# Patient Record
Sex: Female | Born: 2000 | Race: Black or African American | Hispanic: No | Marital: Single | Smoking: Never smoker
Health system: Southern US, Community
[De-identification: ages and names within clinical notes are randomized; demographics above are authoritative.]

## PROBLEM LIST (undated history)

## (undated) DIAGNOSIS — R011 Cardiac murmur, unspecified: Secondary | ICD-10-CM

## (undated) HISTORY — DX: Cardiac murmur, unspecified: R01.1

---
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
## 2020-03-01 ENCOUNTER — Other Ambulatory Visit: Payer: Self-pay

## 2020-03-01 ENCOUNTER — Ambulatory Visit: Payer: Medicaid Other | Attending: Internal Medicine

## 2020-03-01 DIAGNOSIS — Z23 Encounter for immunization: Secondary | ICD-10-CM

## 2020-03-01 NOTE — Progress Notes (Signed)
° °  Covid-19 Vaccination Clinic  Name:  Heather [name]    MRN: [id number] DOB: [birth year]  03/01/2020  [name] was observed post Covid-19 immunization for 15 minutes without incident. She was provided with Vaccine Information Sheet and instruction to access the V-Safe system.   Ms. Candy was instructed to call 911 with any severe reactions post vaccine:  Difficulty breathing   Swelling of face and throat   A fast heartbeat   A bad rash all over body   Dizziness and weakness   Immunizations Administered    Name Date Dose VIS Date Route   PFIZER Comrnaty(Gray TOP) Covid-19 Vaccine 03/01/2020  3:30 PM 0.3 mL 01/06/2020 Intramuscular   Manufacturer: [employer], [employer]   Lot: [id number]   NDC: [phone]

## 2021-12-27 ENCOUNTER — Other Ambulatory Visit: Payer: Self-pay | Admitting: Family Medicine

## 2021-12-27 DIAGNOSIS — Z3401 Encounter for supervision of normal first pregnancy, first trimester: Secondary | ICD-10-CM

## 2022-01-29 ENCOUNTER — Other Ambulatory Visit: Payer: Self-pay | Admitting: Family Medicine

## 2022-01-29 ENCOUNTER — Ambulatory Visit
Admission: RE | Admit: 2022-01-29 | Discharge: 2022-01-29 | Disposition: A | Discharge status: Home / Self Care | Payer: Medicaid Other | Source: Ambulatory Visit | Attending: Family Medicine | Admitting: Family Medicine

## 2022-01-29 DIAGNOSIS — Z3401 Encounter for supervision of normal first pregnancy, first trimester: Secondary | ICD-10-CM

## 2022-01-29 DIAGNOSIS — Z3A11 11 weeks gestation of pregnancy: Secondary | ICD-10-CM | POA: Insufficient documentation

## 2022-02-11 ENCOUNTER — Other Ambulatory Visit: Payer: Self-pay

## 2022-02-11 ENCOUNTER — Emergency Department
Admission: EM | Admit: 2022-02-11 | Discharge: 2022-02-11 | Disposition: A | Discharge status: Home / Self Care | Payer: Medicaid Other | Attending: Emergency Medicine | Admitting: Emergency Medicine

## 2022-02-11 DIAGNOSIS — O219 Vomiting of pregnancy, unspecified: Secondary | ICD-10-CM | POA: Diagnosis not present

## 2022-02-11 DIAGNOSIS — Z3A13 13 weeks gestation of pregnancy: Secondary | ICD-10-CM | POA: Diagnosis not present

## 2022-02-11 LAB — COMPREHENSIVE METABOLIC PANEL
ALT: 18 U/L (ref 0–44)
AST: 20 U/L (ref 15–41)
Albumin: 4.2 g/dL (ref 3.5–5.0)
Alkaline Phosphatase: 41 U/L (ref 38–126)
Anion gap: 12 (ref 5–15)
BUN: 12 mg/dL (ref 6–20)
CO2: 20 mmol/L — ABNORMAL LOW (ref 22–32)
Calcium: 9.6 mg/dL (ref 8.9–10.3)
Chloride: 107 mmol/L (ref 98–111)
Creatinine, Ser: 0.75 mg/dL (ref 0.44–1.00)
GFR, Estimated: >60 mL/min (ref >60)
Glucose, Bld: 79 mg/dL (ref 70–99)
Potassium: 3.6 mmol/L (ref 3.5–5.1)
Sodium: 139 mmol/L (ref 135–145)
Total Bilirubin: 1 mg/dL (ref 0.3–1.2)
Total Protein: 8.2 g/dL — ABNORMAL HIGH (ref 6.5–8.1)

## 2022-02-11 LAB — CBC
HCT: 42.5 % (ref 36.0–46.0)
Hemoglobin: 14 g/dL (ref 12.0–15.0)
MCH: 24.9 pg — ABNORMAL LOW (ref 26.0–34.0)
MCHC: 32.9 g/dL (ref 30.0–36.0)
MCV: 75.6 fL — ABNORMAL LOW (ref 80.0–100.0)
Platelets: 208 10*3/uL (ref 150–400)
RBC: 5.62 MIL/uL — ABNORMAL HIGH (ref 3.87–5.11)
RDW: 13.2 % (ref 11.5–15.5)
WBC: 5.7 10*3/uL (ref 4.0–10.5)
nRBC: 0.4 % — ABNORMAL HIGH (ref 0.0–0.2)

## 2022-02-11 LAB — LIPASE, BLOOD: Lipase: 33 U/L (ref 11–51)

## 2022-02-11 MED ORDER — METOCLOPRAMIDE HCL 10 MG PO TABS: 10.0000 mg | ORAL_TABLET | Freq: Three times a day (TID) | ORAL | 0 refills | Status: DC | PRN

## 2022-02-11 MED ORDER — SODIUM CHLORIDE 0.9 % IV BOLUS
1000.0000 mL | Freq: Once | INTRAVENOUS | Status: AC
  Administered 2022-02-11: 1000 mL via INTRAVENOUS

## 2022-02-11 MED ORDER — METOCLOPRAMIDE HCL 5 MG/ML IJ SOLN
5.0000 mg | Freq: Once | INTRAMUSCULAR | Status: AC
  Administered 2022-02-11: 5 mg via INTRAVENOUS
  Filled 2022-02-11: qty 2

## 2022-02-11 NOTE — Discharge Instructions (Signed)
Please seek medical attention for any high fevers, chest pain, shortness of breath, change in behavior, persistent vomiting, bloody stool or any other new or concerning symptoms.  

## 2022-02-11 NOTE — ED Provider Notes (Signed)
[facility] Provider Note    Event Date/Time   First MD Initiated Contact with Patient 02/11/22 1044     (approximate)   History   Emesis   HPI  Heather [name] is a 22 y.o. female  who presents to the emergency department today because of concern for nausea and vomiting. Patient states she is [redacted] weeks pregnant. Has been having issues with nausea and vomiting during this pregnancy. Has spoken to her ob/gyn who has prescribed B6/Unisom as well as zofran. She states that these medications are no longer effective. She has had some very mild lower abdominal pain. No fevers.       Physical Exam   Triage Vital Signs: ED Triage Vitals  Enc Vitals Group     BP 02/11/22 0948 (!) 121/90     Pulse Rate 02/11/22 0948 (!) 115     Resp 02/11/22 0948 18     Temp 02/11/22 0948 (!) 97.4 F (36.3 C)     Temp Source 02/11/22 0948 Oral     SpO2 02/11/22 0948 98 %     Weight 02/11/22 0953 198 lb (89.8 kg)     Height 02/11/22 0953 5\' 5"  (1.651 m)     Head Circumference --      Peak Flow --      Pain Score 02/11/22 0946 0     Pain Loc --      Pain Edu? --      Excl. in [place]? --     Most recent vital signs: Vitals:   02/11/22 0948  BP: (!) 121/90  Pulse: (!) 115  Resp: 18  Temp: (!) 97.4 F (36.3 C)  SpO2: 98%    General: Awake, alert, oriented. CV:  Good peripheral perfusion. Tachycardia. Resp:  Normal effort. Lungs clear. Abd:  No distention. Minimal tenderness in bilateral lower abdomen.   ED Results / Procedures / Treatments   Labs (all labs ordered are listed, but only abnormal results are displayed) Labs Reviewed  COMPREHENSIVE METABOLIC PANEL - Abnormal; Notable for the following components:      Result Value   CO2 20 (*)    Total Protein 8.2 (*)    All other components within normal limits  CBC - Abnormal; Notable for the following components:   RBC 5.62 (*)    MCV 75.6 (*)    MCH 24.9 (*)    nRBC 0.4 (*)    All other components within  normal limits  LIPASE, BLOOD  URINALYSIS, ROUTINE W REFLEX MICROSCOPIC  POC URINE PREG, ED  ABO/RH     EKG  None   RADIOLOGY None   PROCEDURES:  Critical Care performed: No  Procedures   MEDICATIONS ORDERED IN ED: Medications - No data to display   IMPRESSION / MDM / [place] / ED COURSE  I reviewed the triage vital signs and the nursing notes.                              Differential diagnosis includes, but is not limited to, aki, electrolyte abnormality.  Patient's presentation is most consistent with acute presentation with potential threat to life or bodily function.   Patient presented to the emergency department today because of concerns for nausea vomiting in the setting of early pregnancy.  Patient's blood work without significant electrolyte abnormality, elevation of creatinine.  No concerning leukocytosis.  Patient without any significant abdominal tenderness.  She did feel better after IV fluids and antiemetics.  Will plan on discharging with antiemetics.  Discussed importance of close follow-up with her OB/GYN.     FINAL CLINICAL IMPRESSION(S) / ED DIAGNOSES   Final diagnoses:  Nausea and vomiting in pregnancy     Note:  This document was prepared using Dragon voice recognition software and may include unintentional dictation errors.    Nance Pear, MD 02/11/22 1325

## 2022-02-11 NOTE — ED Triage Notes (Signed)
Pt presents the ED via POV due to emesis. Pt states she is [redacted] weeks pregnant and cannot keep anything down. Pt states she is vomiting coffee ground emesis. Pt states she has not had anything to eat since Saturday. Pt A&Ox4

## 2022-02-21 ENCOUNTER — Other Ambulatory Visit: Payer: Self-pay | Admitting: Family Medicine

## 2022-02-21 DIAGNOSIS — Z3402 Encounter for supervision of normal first pregnancy, second trimester: Secondary | ICD-10-CM

## 2022-03-12 ENCOUNTER — Ambulatory Visit: Payer: Medicaid Other

## 2022-03-20 ENCOUNTER — Ambulatory Visit: Payer: Medicaid Other

## 2022-03-22 ENCOUNTER — Ambulatory Visit
Admission: RE | Admit: 2022-03-22 | Discharge: 2022-03-22 | Disposition: A | Discharge status: Home / Self Care | Payer: Medicaid Other | Source: Ambulatory Visit | Attending: Family Medicine | Admitting: Family Medicine

## 2022-03-22 DIAGNOSIS — Z3A18 18 weeks gestation of pregnancy: Secondary | ICD-10-CM | POA: Insufficient documentation

## 2022-03-22 DIAGNOSIS — Z3402 Encounter for supervision of normal first pregnancy, second trimester: Secondary | ICD-10-CM | POA: Insufficient documentation

## 2022-03-22 DIAGNOSIS — Z3689 Encounter for other specified antenatal screening: Secondary | ICD-10-CM | POA: Insufficient documentation

## 2022-04-08 ENCOUNTER — Ambulatory Visit: Payer: Medicaid Other | Attending: Obstetrics and Gynecology

## 2022-04-08 ENCOUNTER — Ambulatory Visit: Payer: Self-pay | Admitting: Obstetrics and Gynecology

## 2022-04-08 ENCOUNTER — Other Ambulatory Visit: Payer: Self-pay

## 2022-04-08 ENCOUNTER — Ambulatory Visit: Payer: Medicaid Other

## 2022-04-08 VITALS — BP 118/72 | HR 86 | Temp 98.2°F | Ht 65.0 in | Wt 210.5 lb

## 2022-04-08 DIAGNOSIS — O35BXX0 Maternal care for other (suspected) fetal abnormality and damage, fetal cardiac anomalies, not applicable or unspecified: Secondary | ICD-10-CM | POA: Diagnosis not present

## 2022-04-08 DIAGNOSIS — Z3A18 18 weeks gestation of pregnancy: Secondary | ICD-10-CM | POA: Diagnosis not present

## 2022-04-08 NOTE — Progress Notes (Signed)
[name], Heather O Length of Consultation:30 minutes   Heather [name]  was referred for genetic counseling at [facility] to review the results of a prior ultrasound in radiology at [facility].  This letter is a summary of our discussion. The patient was present at this visit alone.  Prior to this visit, Heather [name] had an ultrasound in the main radiology department at [facility].  At that time, the anatomy was reported as normal with the exception of an echogenic intracardiac focus.  The gestational age was confirmed to be 18 weeks 2 days on 03/22/22.     Ultrasound Findings: An echogenic intracardiac focus (EIF) is a bright spot in the heart.  They are usually found in the left ventricle, which is one of the lower chambers of the heart.  This finding is thought to occur in at least 3-5% of second trimester ultrasounds as a result of microcalcifications in the papillary muscle of the heart.  While it is most likely a normal variation, some studies have found an association between this finding and chromosome abnormalities, most commonly Down syndrome.  The finding does not indicate underlying structural abnormalities or disrupt the function of the heart. Current data suggests that as an isolated ultrasound finding, particularly in women under the age of 65 years or who have had normal results from some type of maternal serum screen for aneuploidy, the chance for a chromosome problem is not expected to be higher than the risk from an amniocentesis (1 in 500).  To review, chromosomes are the inherited structures that contain our genes (traits).  Each cell of our body normally has 46 chromosomes, matched up in 23 pairs.  The last pair determines our gender and are called the sex chromosomes.  A female has an X and a Y chromosome, while a female has two X chromosomes.  Rarely, when a mother's egg or father's sperm unite, an extra or missing chromosome can be passed on to the baby by mistake.  We  discussed examples of such a problem including: Down syndrome (an extra 65) and Edward syndrome (an extra 18), both involving some degree of mental retardation and physical problems.  Before this ultrasound, there was a 1 in approximately <500 chance to having a baby with a chromosome problem based on Heather [name]'s age.  Now that the echogenic focus has been seen, the risk may be increased.  In order to more accurately assess the chance for a chromosome condition in this pregnancy, we offered the following prenatal testing options for this pregnancy:  Ultrasound can often detect structural differences in the fetal anatomy that may suggest a chromosome problem; however, this is not always the case.  We would recommend that Heather [name] have a formal detailed anatomy ultrasound with the [facility] clinic to more carefully evaluate the fetal anatomy.  We also reviewed the availability of cell free DNA testing from maternal blood to determine whether or not the baby may have Down syndrome, trisomy 61, or trisomy 60.  This test utilizes a maternal blood sample and DNA sequencing technology to isolate circulating cell free fetal DNA from maternal plasma.  The fetal DNA can then be analyzed for DNA sequences that are derived from the three most common chromosomes involved in aneuploidy, chromosomes 13, 18, and 21.  If the overall amount of DNA is greater than the expected level for any of these chromosomes, aneuploidy is suspected. While we do not consider it a replacement for invasive  testing and karyotype analysis, a negative result from this testing would be reassuring, though not a guarantee of a normal chromosome complement for the baby.  An abnormal result is certainly suggestive of an abnormal chromosome complement, though we would still recommend CVS or amniocentesis to confirm any findings from this testing.  Diagnostic testing for chromosome conditions is also available through amniocentesis.   Amniocentesis involves the removal of a small amount of amniotic fluid from the sac surrounding the fetus with the use of a thin needle inserted through the mother's abdomen and uterus.  Ultrasound guidance is used throughout the procedure.  Fetal cells are directly evaluated and >99.5% of chromosome problems and >98% of neural tube defects can be detected.  The main risks to this procedure are complications leading to miscarriage in less than 1 in 500 cases.      Carrier screening.  Per the ACOG Committee Opinion 691, all women who are considering a pregnancy or are currently pregnant should be offered carrier screening for, at minimum, Cystic Fibrosis (CF), Spinal Muscular Atrophy (SMA), and Hemoglobinopathies The mode of inheritance, clinical manifestations of these conditions, as well as details about testing were reviewed. A negative result on carrier screening reduces the likelihood of being a carrier, however, does not entirely rule out the possibility. If Heather [name] was found to be a carrier for a specific condition, carrier screening for their reproductive partner would be recommended. Newborn screening can also be utilized after delivery to screen for many of these conditions.  Family history: We obtained a detailed family history.  The family history was reported to be unremarkable for birth defects, developmental delays, recurrent pregnancy loss or known chromosome abnormalities. This is the first pregnancy for Heather [name].  She reported no complications or exposure to medications, tobacco, alcohol or recreational drugs during this pregnancy.  She stated that the nausea and vomiting have started to decline. Of note, the patient have a low MCV, which can occur of various reasons.  One possible cause is an inherited hemoglobin variant, which could be screened for in the above mentioned carrier screening.    Plan of Care: Heather [name] is scheduled to return to our clinic on Wed, 3/13 for a detailed  ultrasound.  If she is unable to get off work, she planned to let us know and reschedule. She stated that she would like to speak with her partner, Heather [name], prior to deciding about carrier screening or cell free DNA testing. She is aware that we can schedule a lab only visit or draw these at the time of her ultrasound is desired. (Note low MCV). The patient was very clear that no results of genetic testing would change her commitment to this pregnancy.   The patient was encouraged to call with questions or concerns.  We can be contacted at [phone].  Wilburt Finlay, MS, CGC

## 2022-04-09 ENCOUNTER — Other Ambulatory Visit: Payer: Self-pay

## 2022-04-09 DIAGNOSIS — O35BXX0 Maternal care for other (suspected) fetal abnormality and damage, fetal cardiac anomalies, not applicable or unspecified: Secondary | ICD-10-CM

## 2022-04-10 ENCOUNTER — Ambulatory Visit: Payer: Medicaid Other | Attending: Maternal & Fetal Medicine

## 2022-04-10 ENCOUNTER — Other Ambulatory Visit: Payer: Self-pay

## 2022-04-10 DIAGNOSIS — Z3A21 21 weeks gestation of pregnancy: Secondary | ICD-10-CM | POA: Diagnosis not present

## 2022-04-10 DIAGNOSIS — O35BXX0 Maternal care for other (suspected) fetal abnormality and damage, fetal cardiac anomalies, not applicable or unspecified: Secondary | ICD-10-CM | POA: Insufficient documentation

## 2022-04-10 DIAGNOSIS — E669 Obesity, unspecified: Secondary | ICD-10-CM

## 2022-04-10 DIAGNOSIS — Z363 Encounter for antenatal screening for malformations: Secondary | ICD-10-CM | POA: Diagnosis not present

## 2022-04-10 DIAGNOSIS — O99212 Obesity complicating pregnancy, second trimester: Secondary | ICD-10-CM | POA: Insufficient documentation

## 2022-06-18 ENCOUNTER — Other Ambulatory Visit: Payer: Self-pay | Admitting: Family Medicine

## 2022-06-18 DIAGNOSIS — Z3403 Encounter for supervision of normal first pregnancy, third trimester: Secondary | ICD-10-CM

## 2022-06-18 DIAGNOSIS — O26849 Uterine size-date discrepancy, unspecified trimester: Secondary | ICD-10-CM

## 2022-06-19 ENCOUNTER — Encounter: Payer: Self-pay | Admitting: Physical Therapy

## 2022-06-19 ENCOUNTER — Ambulatory Visit: Payer: Medicaid Other | Attending: Family Medicine | Admitting: Physical Therapy

## 2022-06-19 DIAGNOSIS — M533 Sacrococcygeal disorders, not elsewhere classified: Secondary | ICD-10-CM | POA: Diagnosis present

## 2022-06-19 DIAGNOSIS — R2689 Other abnormalities of gait and mobility: Secondary | ICD-10-CM | POA: Insufficient documentation

## 2022-06-19 DIAGNOSIS — M5459 Other low back pain: Secondary | ICD-10-CM | POA: Diagnosis present

## 2022-06-19 NOTE — Patient Instructions (Addendum)
Body mechanics:   Proper body mechanics with getting out of a chair to decrease strain  on back &pelvic floor   Avoid holding your breath when Getting out of the chair:  Scoot to front part of chair chair Heels behind feet, feet are hip width apart, nose over toes  Inhale like you are smelling roses Exhale to stand    Lifting/ bending with mini squat , exhale as you lift Pick up grabbers tools    getting into bed: "hook one foot under the other, slide into sidelying first instead of lifting legs one at a time  = "mermaid way"   Sleep with pillow between knees  NO straining with bowel movements: breathing at ribcage instead  ____________   Avoid straining pelvic floor, abdominal muscles , spine  Use log rolling technique instead of getting out of bed with your neck or the sit-up     Log rolling into and out of bed   Log rolling into and out of bed If getting out of bed on R side, Bent knees, scoot hips/ shoulder to L  Raise R arm completely overhead, rolling onto armpit  Then lower bent knees to bed to get into complete side lying position  Then drop legs off bed, and push up onto R elbow/forearm, and use L hand to push onto the bed    Dig elbows and feet to lift hte buttocks and scoot without lifting head  __  At work:   Let the toddlers crawl up to you   Will discuss getting to floor next session   __   Getting in and out of car with knees aligned with hips not swing one knee way out.    Move incremental just like rolling in bed     __   Proper body mechanics with getting out of a chair to decrease strain  on back &pelvic floor   Avoid holding your breath when Getting out of the chair:  Scoot to front part of chair chair Heels behind knees, feet are hip width apart, nose over toes  Inhale like you are smelling roses Exhale to stand   _  Avoid straining pelvic floor, abdominal muscles , spine  Use log rolling technique instead of getting out of  bed with your neck or the sit-up     Log rolling into and out of bed   Log rolling into and out of bed If getting out of bed on R side, Bent knees, scoot hips/ shoulder to L  Raise R arm completely overhead, rolling onto armpit  Then lower bent knees to bed to get into complete side lying position  Then drop legs off bed, and push up onto R elbow/forearm, and use L hand to push onto the bed    Dig elbows and feet to lift hte buttocks and scoot without lifting head

## 2022-06-19 NOTE — Therapy (Signed)
OUTPATIENT PHYSICAL THERAPY EVALUATION   Patient Name: Heather [name] MRN: [id number] DOB:2000-07-03, 22 y.o., female Today's Date: 06/19/2022   PT End of Session - 06/19/22 0831     Visit Number 1    Number of Visits 10    Date for PT Re-Evaluation 08/28/22    Authorization Type [employer]  covers upt o 27 visits without prior preauth    PT Start Time 0811    PT Stop Time 0845    PT Time Calculation (min) 34 min    Activity Tolerance Patient tolerated treatment well;No increased pain    Behavior During Therapy WFL for tasks assessed/performed             Past Medical History:  Diagnosis Date   Heart murmur    History reviewed. No pertinent surgical history. There are no problems to display for this patient.   PCP: Marlowe Kays PROVIDER: Marlowe Kays DIAG:  Z52.02 (ICD-10-CM) - Encounter for supervision of normal first pregnancy, second trimester R10.2 (ICD-10-CM) - Pelvic and perineal pain  Rationale for Evaluation and Treatment Rehabilitation  THERAPY DIAG:  Sacrococcygeal disorders, not elsewhere classified  Other abnormalities of gait and mobility  Other low back pain  ONSET DATE:   SUBJECTIVE:                                                                                                                                                                                           SUBJECTIVE STATEMENT: Pt is pregnant with first child, [redacted] weeks pregnant today, due 7/24/ 2024. Pt completed Pelvic PT in the past during her 2nd trimester which helped with pubic pain by 50% improvement.    1) pubic pain:  pain level 6-7/10 after full work week.  Pain with getting in and out of car, rolling in bed.   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 Pt also has to sit on low stools at work and on the floor and has difficulty getting up and down.   2) LBP nonradiating: 2-3/10 pain level after  being on her feet for 4-5 hours at work   PERTINENT HISTORY:  Pt is pregnant with first child, [redacted] weeks pregnant today, due 7/24/ 2024.    PAIN:  Are you having pain? See above   PRECAUTIONS: pregnancy   WEIGHT BEARING RESTRICTIONS: No  FALLS:  Has patient fallen in last 6 months? 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 0 m/s, minimal trunk rotation             [facility] Adult PT Treatment/Exercise - 06/19/22 0837       Transfers   Comments Simulated getting into car with hip abd technique      Therapeutic Activites    Other Therapeutic Activities explained role of boyd mechanics and pelvic girdle stability, role of laxity with pregnancy and bodymecahnics to help pain,. explained plan to address realignment of pelvis and spine next session      Neuro Re-ed    Neuro Re-ed Details  cued for body mechanics at work, getting int/out of car and logrolling in bed               HOME EXERCISE PROGRAM: See pt instruction section    ASSESSMENT:  CLINICAL IMPRESSION:   Pt is a  22  yo  who is in her 3rd trimester of pregnancy and experiences pubic pain and non-radiating LBP  which impact QOL, ADL, work, and community activities.   Pt's musculoskeletal  assessment revealed uneven pelvic girdle and shoulder height, asymmetries to gait pattern, limited spinal /pelvic mobility, dyscoordination and strength of pelvic floor mm, hip weakness, poor body mechanics which places strain on the abdominal/pelvic floor mm. These are deficits that indicate an ineffective intraabdominal pressure system associated with increased risk for pt's Sx.   Pt was provided education on etiology of Sx with anatomy, physiology explanation with images along with the benefits of customized pelvic PT Tx based on pt's medical conditions and musculoskeletal deficits.  Explained the physiology of deep core mm coordination and roles of pelvic floor function in urination, defecation, sexual function, and postural control with deep core mm system.    Following Tx today which pt tolerated without complaints,  pt demo'd proper body mechanics with bed mobility, sit to stand, getting in / out of car to minimize pubic pain and LBP.  Plan to continue with realignment of spine and promote pelvic stability before initiaiting deep core coordination training.  Plan to provide SIJ belt next session.   Pt benefits from skilled PT.    OBJECTIVE IMPAIRMENTS decreased activity tolerance, decreased coordination, decreased endurance, decreased mobility, difficulty walking, decreased ROM, decreased strength, decreased safety awareness, hypomobility, increased muscle spasms, impaired flexibility, improper body mechanics, postural dysfunction, and pain.    ACTIVITY LIMITATIONS  self-care,  sleep, home chores, work tasks    PARTICIPATION LIMITATIONS:  community   PERSONAL FACTORS      are also affecting patient's functional outcome.    REHAB POTENTIAL: Good   CLINICAL DECISION MAKING: Evolving/moderate complexity   EVALUATION COMPLEXITY: Moderate    PATIENT EDUCATION:    Education details:  Showed pt anatomy images. Explained muscles attachments/ connection, physiology of deep core system/  spinal- thoracic-pelvis-lower kinetic chain as they relate to pt's presentation, Sx, and past Hx. Explained what and how these areas of deficits need to be restored to balance and function    See Therapeutic activity / neuromuscular re-education section  Answered pt's questions.   Person educated: Patient Education method: Explanation, Demonstration, Tactile cues, Verbal cues, and Handouts Education comprehension: verbalized understanding, returned demonstration, verbal cues required, tactile cues required, and needs further education     PLAN: PT FREQUENCY: 1x/week   PT DURATION: 10 weeks   PLANNED INTERVENTIONS: Therapeutic exercises, Therapeutic activity, Neuromuscular re-education, Balance training, Gait training, Patient/Family education, Self Care, Joint mobilization, Spinal mobilization, Moist heat, Taping, and Manual therapy, dry needling.   PLAN FOR NEXT SESSION: See clinical impression for plan     GOALS: Goals reviewed with patient? Yes  SHORT TERM GOALS: Target date: 07/17/2022    Pt will demo IND with HEP                    Baseline: Not IND            Goal status: INITIAL   LONG TERM GOALS: Target date: 08/28/2022    1.Pt will demo proper deep core coordination without chest breathing and optimal excursion of diaphragm/pelvic floor in order to promote spinal stability and pelvic floor function  Baseline: dyscoordination Goal status: INITIAL  2.  Pt will demo > 5 pt change on FOTO  to improve QOL and function    Pelvic Pain baseline - 46 Lower score = better function    Lumber baseline  - 71 Higher score = better function   Goal status: INITIAL  3.  Pt will demo proper body mechanics in against gravity tasks and ADLs  work tasks to minimize straining pelvic floor / back                  Baseline: not IND, improper form that places strain on pelvic floor                Goal status: INITIAL    4. Pt will demo levelled pelvic girdle and shoulder  height in order to progress to deep core strengthening HEP and restore mobility at spine, pelvis, gait, posture   Baseline: R shoulder, L iliac crest higher, L convex lumbar curve  Goal status: INITIAL    5. Pt will demo increased gait speed > 1.3 m/s in order to ambulate safely in community and return to fitness routine  Baseline: 1. 0 m/s, minimal trunk rotation  Goal status: INITIAL     Mariane Masters, PT 06/19/2022, 8:46 AM

## 2022-06-26 ENCOUNTER — Ambulatory Visit: Payer: Medicaid Other | Admitting: Physical Therapy

## 2022-07-11 ENCOUNTER — Ambulatory Visit
Admission: RE | Admit: 2022-07-11 | Discharge: 2022-07-11 | Disposition: A | Discharge status: Home / Self Care | Payer: Medicaid Other | Source: Ambulatory Visit | Attending: Family Medicine | Admitting: Family Medicine

## 2022-07-11 ENCOUNTER — Ambulatory Visit: Payer: Medicaid Other | Attending: Family Medicine | Admitting: Physical Therapy

## 2022-07-11 DIAGNOSIS — M5459 Other low back pain: Secondary | ICD-10-CM | POA: Diagnosis present

## 2022-07-11 DIAGNOSIS — O26849 Uterine size-date discrepancy, unspecified trimester: Secondary | ICD-10-CM | POA: Insufficient documentation

## 2022-07-11 DIAGNOSIS — Z3403 Encounter for supervision of normal first pregnancy, third trimester: Secondary | ICD-10-CM | POA: Insufficient documentation

## 2022-07-11 DIAGNOSIS — R2689 Other abnormalities of gait and mobility: Secondary | ICD-10-CM | POA: Insufficient documentation

## 2022-07-11 DIAGNOSIS — Z3A34 34 weeks gestation of pregnancy: Secondary | ICD-10-CM | POA: Insufficient documentation

## 2022-07-11 DIAGNOSIS — M533 Sacrococcygeal disorders, not elsewhere classified: Secondary | ICD-10-CM | POA: Insufficient documentation

## 2022-07-11 DIAGNOSIS — O26843 Uterine size-date discrepancy, third trimester: Secondary | ICD-10-CM | POA: Insufficient documentation

## 2022-07-11 NOTE — Therapy (Signed)
OUTPATIENT PHYSICAL THERAPY Treatment    Patient Name: Heather [name] MRN: [id number] DOB:[birth year], 22 y.o., female Today's Date: 07/11/2022   PT End of Session - 07/11/22 0936     Visit Number 2    Number of Visits 10    Date for PT Re-Evaluation 08/28/22    Authorization Type [employer]  covers up to  27 visits without prior preauth    Authorization - Visit Number 2    Authorization - Number of Visits 27    PT Start Time [phone]    PT Stop Time 1015    PT Time Calculation (min) 38 min    Activity Tolerance Patient tolerated treatment well;No increased pain    Behavior During Therapy WFL for tasks assessed/performed             Past Medical History:  Diagnosis Date   Heart murmur    No past surgical history on file. There are no problems to display for this patient.   PCP: Marlowe Kays PROVIDER: Marlowe Kays DIAG:  Z25.02 (ICD-10-CM) - Encounter for supervision of normal first pregnancy, second trimester R10.2 (ICD-10-CM) - Pelvic and perineal pain  Rationale for Evaluation and Treatment Rehabilitation  THERAPY DIAG:  Other abnormalities of gait and mobility  Other low back pain  Sacrococcygeal disorders, not elsewhere classified  ONSET DATE:   SUBJECTIVE:            SUBJECTIVE STATEMENT :                  Pt reports no more LBP since last session as pt has practiced body mechanics with in and out of car.                  Pt feels LBP at the end of long work day of 2 jobs 11-12 hours. 4/10 level of pain, non radiating.                                                                                                                                                                 SUBJECTIVE STATEMENT ON EVAL : Pt is pregnant with first child, [redacted] weeks pregnant today, due 7/24/ 2024. Pt completed Pelvic PT in the past during her 2nd trimester which helped with pubic pain by 50% improvement.    1) pubic pain:  pain level 6-7/10 after full work week.   Pain with getting in and out of car, rolling in bed.   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 Pt also has to sit on low stools at work and on the floor and has difficulty getting up and down.   2) LBP nonradiating: 2-3/10 pain level  after being on her feet for 4-5 hours at work   PERTINENT HISTORY:  Pt is pregnant with first child, [redacted] weeks pregnant today, due 7/24/ 2024.    PAIN:  Are you having pain? See above   PRECAUTIONS: pregnancy   WEIGHT BEARING RESTRICTIONS: No  FALLS:  Has patient fallen in last 6 months? 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   PLOF: Independent  PATIENT GOALS:  Prioritizing her wellness during pregnany and after   OBJECTIVE:    [facility] PT Assessment - 07/11/22 1019       Palpation   Spinal mobility levelled shoulder/ pelvis             [facility] Adult PT Treatment/Exercise - 07/11/22 1018       Therapeutic Activites    Other Therapeutic Activities educated on don SIJ belt and to wear duyring long work shifts , discussed wearing shoes with sole support not sandals ,  body mechanics      Neuro Re-ed    Neuro Re-ed Details  cued for deep core and sidestretches to optimize deep core              HOME EXERCISE PROGRAM: See pt instruction section    ASSESSMENT:  CLINICAL IMPRESSION:   Pt demo'd levelled pelvis and shoulder alignment which deemed her readiness for deep core training.    Initiated deep core coordination training with coordination cues and rationale for lumbar stability and pregnancy.  Provided SIJ belt with cues for donning and wear frequency for optimal pelvic stability.   Plan to educate instructions on use of TrA and deep core system for L & D.    Pt  benefits from skilled PT.    OBJECTIVE IMPAIRMENTS decreased activity tolerance, decreased coordination, decreased endurance, decreased mobility, difficulty walking, decreased ROM, decreased strength, decreased safety awareness, hypomobility, increased muscle spasms, impaired flexibility, improper body mechanics, postural dysfunction, and pain.    ACTIVITY LIMITATIONS  self-care,  sleep, home chores, work tasks    PARTICIPATION LIMITATIONS:  community   PERSONAL FACTORS      are also affecting patient's functional outcome.    REHAB POTENTIAL: Good   CLINICAL DECISION MAKING: Evolving/moderate complexity   EVALUATION COMPLEXITY: Moderate    PATIENT EDUCATION:    Education details: Showed pt anatomy images. Explained muscles attachments/ connection, physiology of deep core system/ spinal- thoracic-pelvis-lower kinetic chain as they relate to pt's presentation, Sx, and past Hx. Explained what and how these areas of deficits need to be restored to balance and function    See Therapeutic activity / neuromuscular re-education section  Answered pt's questions.   Person educated: Patient Education method: Explanation, Demonstration, Tactile cues, Verbal cues, and Handouts Education comprehension: verbalized understanding, returned demonstration, verbal cues required, tactile cues required, and needs further education     PLAN: PT FREQUENCY: 1x/week   PT DURATION: 10 weeks   PLANNED INTERVENTIONS: Therapeutic exercises, Therapeutic activity, Neuromuscular re-education, Balance training, Gait training, Patient/Family education, Self Care, Joint mobilization, Spinal mobilization, Moist heat, Taping, and Manual therapy, dry needling.   PLAN FOR NEXT SESSION: See clinical impression for plan     GOALS: Goals reviewed with patient? Yes  SHORT TERM GOALS: Target date: 07/17/2022    Pt will demo IND with HEP  Baseline: Not IND            Goal status:  INITIAL   LONG TERM GOALS: Target date: 08/28/2022    1.Pt will demo proper deep core coordination without chest breathing and optimal excursion of diaphragm/pelvic floor in order to promote spinal stability and pelvic floor function  Baseline: dyscoordination Goal status: INITIAL  2.  Pt will demo > 5 pt change on FOTO  to improve QOL and function    Pelvic Pain baseline - 46 Lower score = better function    Lumber baseline  - 71 Higher score = better function   Goal status: INITIAL  3.  Pt will demo proper body mechanics in against gravity tasks and ADLs  work tasks to minimize straining pelvic floor / back                  Baseline: not IND, improper form that places strain on pelvic floor                Goal status: INITIAL    4. Pt will demo levelled pelvic girdle and shoulder height in order to progress to deep core strengthening HEP and restore mobility at spine, pelvis, gait, posture   Baseline: R shoulder, L iliac crest higher, L convex lumbar curve  Goal status: INITIAL    5. Pt will demo increased gait speed > 1.3 m/s in order to ambulate safely in community and return to fitness routine  Baseline: 1. 0 m/s, minimal trunk rotation  Goal status: INITIAL     Mariane Masters, PT 07/11/2022, 9:46 AM

## 2022-07-11 NOTE — Patient Instructions (Addendum)
SIJ belt to wear all day , esp at work   ____ Morning and night   Semi reclined position  Onto pillows as a ramp   -side stretch and high five the sky stretch to open the ribs  -deep core 1-2    Log rolling out of the position and not do a head lift to minimize diastasis / bearing down on pelvic floor  __

## 2022-07-18 ENCOUNTER — Ambulatory Visit: Payer: Medicaid Other | Admitting: Physical Therapy

## 2022-07-18 DIAGNOSIS — R2689 Other abnormalities of gait and mobility: Secondary | ICD-10-CM

## 2022-07-18 DIAGNOSIS — M5459 Other low back pain: Secondary | ICD-10-CM

## 2022-07-18 DIAGNOSIS — O26843 Uterine size-date discrepancy, third trimester: Secondary | ICD-10-CM | POA: Diagnosis not present

## 2022-07-18 DIAGNOSIS — M533 Sacrococcygeal disorders, not elsewhere classified: Secondary | ICD-10-CM

## 2022-07-18 NOTE — Therapy (Signed)
OUTPATIENT PHYSICAL THERAPY Treatment    Patient Name: Heather [name] MRN: [id number] DOB:[birth year], 22 y.o., female Today's Date: 07/18/2022   PT End of Session - 07/18/22 0941     Visit Number 3    Number of Visits 10    Date for PT Re-Evaluation 08/28/22    Authorization Type [employer]  covers up to  27 visits without prior preauth    Authorization - Number of Visits 27    PT Start Time [phone]    PT Stop Time 1015    PT Time Calculation (min) 38 min    Activity Tolerance Patient tolerated treatment well;No increased pain    Behavior During Therapy WFL for tasks assessed/performed             Past Medical History:  Diagnosis Date   Heart murmur    No past surgical history on file. There are no problems to display for this patient.   PCP: Marlowe Kays PROVIDER: Marlowe Kays DIAG:  Z79.02 (ICD-10-CM) - Encounter for supervision of normal first pregnancy, second trimester R10.2 (ICD-10-CM) - Pelvic and perineal pain  Rationale for Evaluation and Treatment Rehabilitation  THERAPY DIAG:  Other abnormalities of gait and mobility  Other low back pain  Sacrococcygeal disorders, not elsewhere classified  ONSET DATE:   SUBJECTIVE:            SUBJECTIVE STATEMENT :                  Pt reports she has not been sleeping well due to L LBP radiating to mid posterior thigh. Pt has been wearing a SIJ belt.                                                                                                                                                              SUBJECTIVE STATEMENT ON EVAL : Pt is pregnant with first child, [redacted] weeks pregnant today, due 7/24/ 2024. Pt completed Pelvic PT in the past during her 2nd trimester which helped with pubic pain by 50% improvement.    1) pubic pain:  pain level 6-7/10 after full work week.  Pain with getting in and out of car, rolling in bed.   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 Pt also has to sit on low stools at work and on the floor and has difficulty getting up and down.   2) LBP nonradiating: 2-3/10 pain level after being on her feet for 4-5 hours at work   PERTINENT HISTORY:  Pt is pregnant with first child, [redacted] weeks pregnant today, due 7/24/ 2024.    PAIN:  Are you having pain? See above   PRECAUTIONS: pregnancy  WEIGHT BEARING RESTRICTIONS: No  FALLS:  Has patient fallen in last 6 months? 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   PLOF: Independent  PATIENT GOALS:  Prioritizing her wellness during pregnany and after   OBJECTIVE:    [facility] PT Assessment - 07/18/22 0941       Sit to Stand   Comments medial collpase of feet, genu valgus B      Palpation   Spinal mobility R shoulder and L iliac crest higher,    Palpation comment tenderness/ hypomobile L SIJ, iliac crest limited ER      Ambulation/Gait   Gait Comments wearing slip sandals without back straps B             [facility] Adult PT Treatment/Exercise - 07/18/22 0941       Neuro Re-ed    Neuro Re-ed Details  cued for less medial collpase in sit to stand, cued for proper bed rolling technique      Modalities   Modalities Moist Heat      Moist Heat Therapy   Number Minutes Moist Heat 5 Minutes    Moist Heat Location --   sidelying, L hip, while pt performed R clamshells     Manual Therapy   Manual therapy comments PA mob Grade II, superior glide at SIJ and STM/MWM to promote ER of L ilia, glut, adductors                HOME EXERCISE PROGRAM: See pt instruction section    ASSESSMENT:  CLINICAL IMPRESSION:   Pt is compliant with wearing SIJ belt, Pt showed misalignment of pelvic girdle which is related to her pain today. Pt required  manual Tx to improve SIJ mobility on L and pt reported decreased pain post Tx.  Required cues with sit to stand with use of arms and more equal pressure to pinky toe side of feet to minimize medial collapse of feet and improve ER of knee to minimize internal rotaiton of L ilia. Required cues for proper log rolling technique to minimize pubic pain.  Plan to continue to strengthen foot arches and train propioception of LKC to promote pelvic girdle stability.  Plan to educate instructions on use of TrA and deep core system for L & D.    Pt benefits from skilled PT.    OBJECTIVE IMPAIRMENTS decreased activity tolerance, decreased coordination, decreased endurance, decreased mobility, difficulty walking, decreased ROM, decreased strength, decreased safety awareness, hypomobility, increased muscle spasms, impaired flexibility, improper body mechanics, postural dysfunction, and pain.    ACTIVITY LIMITATIONS  self-care,  sleep, home chores, work tasks    PARTICIPATION LIMITATIONS:  community   PERSONAL FACTORS      are also affecting patient's functional outcome.    REHAB POTENTIAL: Good   CLINICAL DECISION MAKING: Evolving/moderate complexity   EVALUATION COMPLEXITY: Moderate    PATIENT EDUCATION:    Education details: Showed pt anatomy images. Explained muscles attachments/ connection, physiology of deep core system/ spinal- thoracic-pelvis-lower kinetic chain as they relate to pt's presentation, Sx, and past Hx. Explained what and how these areas of deficits need to be restored to balance and function    See Therapeutic activity / neuromuscular re-education section  Answered pt's questions.   Person educated: Patient Education method: Explanation, Demonstration, Tactile cues, Verbal cues,  and Handouts Education comprehension: verbalized understanding, returned demonstration, verbal cues required, tactile cues required, and needs further education     PLAN: PT FREQUENCY: 1x/week   PT  DURATION: 10 weeks   PLANNED INTERVENTIONS: Therapeutic exercises, Therapeutic activity, Neuromuscular re-education, Balance training, Gait training, Patient/Family education, Self Care, Joint mobilization, Spinal mobilization, Moist heat, Taping, and Manual therapy, dry needling.   PLAN FOR NEXT SESSION: See clinical impression for plan     GOALS: Goals reviewed with patient? Yes  SHORT TERM GOALS: Target date: 07/17/2022    Pt will demo IND with HEP                    Baseline: Not IND            Goal status: INITIAL   LONG TERM GOALS: Target date: 08/28/2022    1.Pt will demo proper deep core coordination without chest breathing and optimal excursion of diaphragm/pelvic floor in order to promote spinal stability and pelvic floor function  Baseline: dyscoordination Goal status: INITIAL  2.  Pt will demo > 5 pt change on FOTO  to improve QOL and function    Pelvic Pain baseline - 46 Lower score = better function    Lumber baseline  - 71 Higher score = better function   Goal status: INITIAL  3.  Pt will demo proper body mechanics in against gravity tasks and ADLs  work tasks to minimize straining pelvic floor / back                  Baseline: not IND, improper form that places strain on pelvic floor                Goal status: INITIAL    4. Pt will demo levelled pelvic girdle and shoulder height in order to progress to deep core strengthening HEP and restore mobility at spine, pelvis, gait, posture   Baseline: R shoulder, L iliac crest higher, L convex lumbar curve  Goal status: INITIAL    5. Pt will demo increased gait speed > 1.3 m/s in order to ambulate safely in community and return to fitness routine  Baseline: 1. 0 m/s, minimal trunk rotation  Goal status: INITIAL     Mariane Masters, PT 07/18/2022, 9:43 AM

## 2022-07-18 NOTE — Patient Instructions (Signed)
Sit to stand with use of arms and more equal pressure to pinky toe side of feet  Log rolling with 4 points of contact and not opening knees out too much

## 2022-07-25 ENCOUNTER — Ambulatory Visit: Payer: Medicaid Other | Admitting: Physical Therapy

## 2022-07-25 DIAGNOSIS — M5459 Other low back pain: Secondary | ICD-10-CM

## 2022-07-25 DIAGNOSIS — R2689 Other abnormalities of gait and mobility: Secondary | ICD-10-CM

## 2022-07-25 DIAGNOSIS — M533 Sacrococcygeal disorders, not elsewhere classified: Secondary | ICD-10-CM

## 2022-07-25 DIAGNOSIS — O26843 Uterine size-date discrepancy, third trimester: Secondary | ICD-10-CM | POA: Diagnosis not present

## 2022-07-25 NOTE — Patient Instructions (Addendum)
PELVIC FLOOR / KEGEL EXERCISES   Pelvic floor/ Kegel exercises are used to strengthen the muscles in the base of your pelvis that are responsible for supporting your pelvic organs and preventing urine/feces leakage. Based on your therapist's recommendations, they can be performed while standing, sitting, or lying down. Imagine pelvic floor area as a diamond with pelvic landmarks: top =pubic bone, bottom tip=tailbone, sides=sitting bones (ischial tuberosities).    Make yourself aware of this muscle group by using these cues while coordinating your breath: Inhale, feel pelvic floor diamond area lower like hammock towards your feet and ribcage/belly expanding. Pause. Let the exhale naturally and feel your belly sink, abdominal muscles hugging in around you and you may notice the pelvic diamond draws upward towards your head forming a umbrella shape. Give a squeeze during the exhalation like you are stopping the flow of urine. If you are squeezing the buttock muscles, try to give 50% less effort.   Make yourself aware of this muscle group by using these cues: Blueberry imagery Telescope imagery Arcade machine plush animal suction lift  Jelly fish lift   Focus on the perineum, the center of the pelvic floor muscles to engage the whole set of muscles , lowering like a bowl on inhale as ribs expand like tree rings, on exhale, pelvic at the perineum gently lifts , low belly sinks, upper belly sinks.   Common Errors: Breath holding: If you are holding your breath, you may be bearing down against your bladder instead of pulling it up. If you belly bulges up while you are squeezing, you are holding your breath. Be sure to breathe gently in and out while exercising. Counting out loud may help you avoid holding your breath. Accessory muscle use: You should not see or feel other muscle movement when performing pelvic floor exercises. When done properly, no one can tell that you are performing the exercises.  Keep the buttocks, belly and inner thighs relaxed. Overdoing it: Your muscles can fatigue and stop working for you if you over-exercise. You may actually leak more or feel soreness at the lower abdomen or rectum.  YOUR HOME EXERCISE PROGRAM    SHORT HOLDS: Position:  sitting  Inhale and then exhale. Then squeeze the muscle.  (Be sure to let belly sink in with exhales and not push outward) Perform 5 repetitions, 3  different  Times/day   Deep core level 1-2 another set in the middle of the day seated) and then at night                   DECREASE DOWNWARD PRESSURE ON  YOUR PELVIC FLOOR, ABDOMINAL, LOW BACK MUSCLES       PRESERVE YOUR PELVIC HEALTH LONG-TERM   ** SQUEEZE pelvic floor BEFORE YOUR SNEEZE, COUGH, LAUGH   ** EXHALE BEFORE YOU RISE AGAINST GRAVITY (lifting, sit to stand, from squat to stand)   ** LOG ROLL OUT OF BED INSTEAD OF CRUNCH/SIT-UP   __  Seated with green band behind buttocks and hips, thumbs out   Inhale, round back ribs out  Exhale pull apart   15 reps x 2 x day    __  During labor, use belly mm to push like toothpaste on cxhale

## 2022-07-25 NOTE — Therapy (Signed)
OUTPATIENT PHYSICAL THERAPY Treatment    Patient Name: Heather [name] MRN: [id number] DOB:Nov 05, 2000, 22 y.o., female Today's Date: 07/25/2022   PT End of Session - 07/25/22 0947     Visit Number 4    Number of Visits 10    Date for PT Re-Evaluation 08/28/22    Authorization Type [employer]  covers up to  27 visits without prior preauth    Authorization - Number of Visits 27    PT Start Time 0945    PT Stop Time 1015    PT Time Calculation (min) 30 min    Activity Tolerance Patient tolerated treatment well;No increased pain    Behavior During Therapy WFL for tasks assessed/performed             Past Medical History:  Diagnosis Date   Heart murmur    No past surgical history on file. There are no problems to display for this patient.   PCP: Marlowe Kays PROVIDER: Marlowe Kays DIAG:  Z75.02 (ICD-10-CM) - Encounter for supervision of normal first pregnancy, second trimester R10.2 (ICD-10-CM) - Pelvic and perineal pain  Rationale for Evaluation and Treatment Rehabilitation  THERAPY DIAG:  Other abnormalities of gait and mobility  Other low back pain  Sacrococcygeal disorders, not elsewhere classified  ONSET DATE:   SUBJECTIVE:            SUBJECTIVE STATEMENT :                  Pt reports she has no more LBP when sleeping.                                                                                                                                                              SUBJECTIVE STATEMENT ON EVAL : Pt is pregnant with first child, [redacted] weeks pregnant today, due 7/24/ 2024. Pt completed Pelvic PT in the past during her 2nd trimester which helped with pubic pain by 50% improvement.    1) pubic pain:  pain level 6-7/10 after full work week.  Pain with getting in and out of car, rolling in bed.   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 Pt also has to sit on low stools at work  and on the floor and has difficulty getting up and down.   2) LBP nonradiating: 2-3/10 pain level after being on her feet for 4-5 hours at work   PERTINENT HISTORY:  Pt is pregnant with first child, [redacted] weeks pregnant today, due 7/24/ 2024.    PAIN:  Are you having pain? See above   PRECAUTIONS: pregnancy   WEIGHT BEARING RESTRICTIONS: No  FALLS:  Has patient fallen in last 6 months? 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 5reps of quick contractions ,.  demo'd proper labor breathing technique               HOME EXERCISE PROGRAM: See pt instruction section    ASSESSMENT:  CLINICAL IMPRESSION:    Pt reports she has no more LBP when sleeping. Pt has no more radiating LBP. Pt is working less and not on her feet as well.   Progressed to seated kegels and labor breathing technique. Pt was cued for proper technique.   Progressed deep core level 1-2 in seated to increase more reps.    Added posterior back strengthening in seated position with green band, Cues provided to minimize lumbar lordosis    Plan to continue to strengthen foot arches and train propioception of LKC to promote pelvic girdle stability.    Pt benefits from skilled PT.   Session was abbreviated due to late arrival.     OBJECTIVE IMPAIRMENTS decreased activity tolerance, decreased coordination, decreased endurance, decreased mobility, difficulty walking,  decreased ROM, decreased strength, decreased safety awareness, hypomobility, increased muscle spasms, impaired flexibility, improper body mechanics, postural dysfunction, and pain.    ACTIVITY LIMITATIONS  self-care,  sleep, home chores, work tasks    PARTICIPATION LIMITATIONS:  community   PERSONAL FACTORS      are also affecting patient's functional outcome.    REHAB POTENTIAL: Good   CLINICAL DECISION MAKING: Evolving/moderate complexity   EVALUATION COMPLEXITY: Moderate    PATIENT EDUCATION:    Education details: Showed pt anatomy images. Explained muscles attachments/ connection, physiology of deep core system/ spinal- thoracic-pelvis-lower kinetic chain as they relate to pt's presentation, Sx, and past Hx. Explained what and how these areas of deficits need to be restored to balance and function    See Therapeutic activity / neuromuscular re-education section  Answered pt's questions.   Person educated: Patient Education method: Explanation, Demonstration, Tactile cues, Verbal cues, and Handouts Education comprehension: verbalized understanding, returned demonstration, verbal cues required, tactile cues required, and needs further education     PLAN: PT FREQUENCY: 1x/week   PT DURATION: 10 weeks   PLANNED INTERVENTIONS: Therapeutic exercises, Therapeutic activity, Neuromuscular re-education, Balance training, Gait training, Patient/Family education, Self Care, Joint mobilization, Spinal mobilization, Moist heat, Taping, and Manual therapy, dry needling.   PLAN FOR NEXT SESSION: See clinical impression for plan     GOALS: Goals reviewed with patient? Yes  SHORT TERM GOALS: Target date: 07/17/2022    Pt will demo IND with HEP                    Baseline: Not IND            Goal status: INITIAL   LONG TERM GOALS: Target date: 08/28/2022    1.Pt will demo proper deep core coordination without chest breathing and optimal excursion of diaphragm/pelvic floor in  order  to promote spinal stability and pelvic floor function  Baseline: dyscoordination Goal status: INITIAL  2.  Pt will demo > 5 pt change on FOTO  to improve QOL and function    Pelvic Pain baseline - 46 Lower score = better function    Lumber baseline  - 71 Higher score = better function   Goal status: INITIAL  3.  Pt will demo proper body mechanics in against gravity tasks and ADLs  work tasks to minimize straining pelvic floor / back                  Baseline: not IND, improper form that places strain on pelvic floor                Goal status: INITIAL    4. Pt will demo levelled pelvic girdle and shoulder height in order to progress to deep core strengthening HEP and restore mobility at spine, pelvis, gait, posture   Baseline: R shoulder, L iliac crest higher, L convex lumbar curve  Goal status: INITIAL    5. Pt will demo increased gait speed > 1.3 m/s in order to ambulate safely in community and return to fitness routine  Baseline: 1. 0 m/s, minimal trunk rotation  Goal status: INITIAL     Mariane Masters, PT 07/25/2022, 9:48 AM

## 2022-08-07 ENCOUNTER — Ambulatory Visit: Payer: Medicaid Other | Admitting: Physical Therapy

## 2022-08-07 ENCOUNTER — Telehealth: Payer: Self-pay | Admitting: Physical Therapy

## 2022-08-07 NOTE — Telephone Encounter (Signed)
Physical therapist called pt about appt today. Pt's voicemail was not set up to leave a message.

## 2022-08-14 ENCOUNTER — Encounter: Payer: Medicaid Other | Admitting: Physical Therapy

## 2022-08-21 ENCOUNTER — Encounter: Payer: Medicaid Other | Admitting: Physical Therapy

## 2022-08-28 ENCOUNTER — Encounter: Payer: Medicaid Other | Admitting: Physical Therapy

## 2022-10-08 ENCOUNTER — Ambulatory Visit: Payer: Medicaid Other | Attending: Family Medicine | Admitting: Physical Therapy

## 2022-10-15 ENCOUNTER — Ambulatory Visit: Payer: Medicaid Other | Admitting: Physical Therapy

## 2022-10-22 ENCOUNTER — Ambulatory Visit: Payer: Medicaid Other | Admitting: Physical Therapy

## 2022-10-29 ENCOUNTER — Ambulatory Visit: Payer: Medicaid Other | Attending: Family Medicine | Admitting: Physical Therapy

## 2022-10-29 DIAGNOSIS — R2689 Other abnormalities of gait and mobility: Secondary | ICD-10-CM | POA: Insufficient documentation

## 2022-10-29 DIAGNOSIS — M533 Sacrococcygeal disorders, not elsewhere classified: Secondary | ICD-10-CM | POA: Insufficient documentation

## 2022-10-29 DIAGNOSIS — M5459 Other low back pain: Secondary | ICD-10-CM | POA: Insufficient documentation

## 2022-10-29 NOTE — Therapy (Unsigned)
OUTPATIENT PHYSICAL THERAPY Treatment / Recert    Patient Name: Heather [name] MRN: [id number] DOB:05/12/2000, 22 y.o., female Today's Date: 10/29/2022   PT End of Session - 10/29/22 7829     Visit Number 5    Number of Visits 10    Date for PT Re-Evaluation 01/07/23    Authorization Type [employer]  covers up to  27 visits without prior preauth    Authorization - Number of Visits 27    PT Start Time 0930    PT Stop Time 1015    PT Time Calculation (min) 45 min    Activity Tolerance Patient tolerated treatment well;No increased pain    Behavior During Therapy WFL for tasks assessed/performed             Past Medical History:  Diagnosis Date   Heart murmur    No past surgical history on file. There are no problems to display for this patient.   PCP: Marlowe Kays PROVIDER: Marlowe Kays DIAG:  Z43.02 (ICD-10-CM) - Encounter for supervision of normal first pregnancy, second trimester R10.2 (ICD-10-CM) - Pelvic and perineal pain  Rationale for Evaluation and Treatment Rehabilitation  THERAPY DIAG:  Other abnormalities of gait and mobility  Other low back pain  Sacrococcygeal disorders, not elsewhere classified  ONSET DATE:   SUBJECTIVE:            SUBJECTIVE STATEMENT :                  Pt had a vaginal delivery on 08/26/22. Pt had no complications and had only a "pinpoint tear".   Pt had a [facility]la through [facility]. Pt used the breathing technique which helped with delivery.  Pt is now having back pain with breastfeeding. Non-radiating pain. 6/10 pain  Baby is 39 weeks old today and weighs 16 lbs                                                                                                                                                              SUBJECTIVE STATEMENT ON EVAL : Pt is pregnant with first child, [redacted] weeks pregnant today, due 7/24/ 2024. Pt completed Pelvic PT in the past during her 2nd trimester which helped with pubic pain by 50% improvement.     1) pubic pain:  pain level 6-7/10 after full work week.  Pain with getting in and out of car, rolling in bed.   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 Pt also has to sit on low stools at work and on the floor and has difficulty getting up and down.   2) LBP nonradiating: 2-3/10 pain level after being on her feet for 4-5 hours at  work   PERTINENT HISTORY:  Pt is pregnant with first child, [redacted] weeks pregnant today, due 7/24/ 2024.    PAIN:  Are you having pain? See above   PRECAUTIONS: pregnancy   WEIGHT BEARING RESTRICTIONS: No  FALLS:  Has patient fallen in last 6 months? 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   PLOF: Independent  PATIENT GOALS:  Prioritizing her wellness during pregnany and after   OBJECTIVE:      [facility] PT Assessment -       Squat   Comments changing diaper with plinth raised to height to simulare changing table, observed pt with poor bending and standing body mechanics      Other:   Other/ Comments breastfeeding position: rounded shoulders             [facility] Adult PT Treatment/Exercise -       Therapeutic Activites    Other Therapeutic Activities explained semi tandem stance and BLE WBing in minisquat to avoid bending      Neuro Re-ed    Neuro Re-ed Details  cued for minisquat side step to strengthen hip abduction, cued for proper standing and bending position to minimize back pain and promote pelvic girdle stability post partum, cued for shoulder squeezes during breastfeeding to strengthen midback mm and minimize midback pain                 HOME EXERCISE PROGRAM: See pt instruction section    ASSESSMENT:  CLINICAL IMPRESSION:   Pt had a vaginal delivery on  08/26/22. Pt had no complications and had only a "pinpoint tear".   Pt had a doula through [facility]. Pt used the breathing technique which helped with delivery. Back pain improved prior to L & D with compliance to prenatal Pelvic PT HEP.  Pt only has midback pain with breastfeeding now. Non-radiating pain. 6/10 pain.  Baby is 70 weeks old today and weighs 16 lbs  Pt 's post-partum assessment showed no diastasis recti separation. Pt showed levelled pelvic girdle and spinal alignment. Pt  showed poor body mechanics for bending , lifting, and standing when changing bay diapers, breastfeeding. Cued for minisquat side step to strengthen hip abduction, cued for proper standing and bending position to minimize back pain and promote pelvic girdle stability post partum, cued for shoulder squeezes during breastfeeding to strengthen midback mm and minimize midback pain. Pt demo'd proper mechanics post training.   Pt met one goal with levelled pelvic girdle and spine and pt was progressing well towards remaining goals.   Pt benefits from skilled PT.     OBJECTIVE IMPAIRMENTS decreased activity tolerance, decreased coordination, decreased endurance, decreased mobility, difficulty walking, decreased ROM, decreased strength, decreased safety awareness, hypomobility, increased muscle spasms, impaired flexibility, improper body mechanics, postural dysfunction, and pain.    ACTIVITY LIMITATIONS  self-care,  sleep, home chores, work tasks    PARTICIPATION LIMITATIONS:  community   PERSONAL FACTORS      are also affecting patient's functional outcome.    REHAB POTENTIAL: Good   CLINICAL DECISION MAKING: Evolving/moderate complexity   EVALUATION COMPLEXITY: Moderate    PATIENT EDUCATION:    Education details: Showed pt anatomy images. Explained muscles attachments/ connection, physiology of deep core system/ spinal- thoracic-pelvis-lower kinetic chain as they relate to pt's presentation, Sx, and past  Hx. Explained  what and how these areas of deficits need to be restored to balance and function    See Therapeutic activity / neuromuscular re-education section  Answered pt's questions.   Person educated: Patient Education method: Explanation, Demonstration, Tactile cues, Verbal cues, and Handouts Education comprehension: verbalized understanding, returned demonstration, verbal cues required, tactile cues required, and needs further education     PLAN: PT FREQUENCY: 1x/week   PT DURATION: 10 weeks   PLANNED INTERVENTIONS: Therapeutic exercises, Therapeutic activity, Neuromuscular re-education, Balance training, Gait training, Patient/Family education, Self Care, Joint mobilization, Spinal mobilization, Moist heat, Taping, and Manual therapy, dry needling.   PLAN FOR NEXT SESSION: See clinical impression for plan     GOALS: Goals reviewed with patient? Yes  SHORT TERM GOALS: Target date: 07/17/2022    Pt will demo IND with HEP                    Baseline: Not IND            Goal status: INITIAL   LONG TERM GOALS: Target date: 01/08/2023    1.Pt will demo proper deep core coordination without chest breathing and optimal excursion of diaphragm/pelvic floor in order to promote spinal stability and pelvic floor function  Baseline: dyscoordination Goal status: INITIAL  2.  Pt will demo > 5 pt change on FOTO  to improve QOL and function    Pelvic Pain baseline - 46 Lower score = better function    Lumber baseline  - 71 Higher score = better function   Goal status: ongoing   3.  Pt will demo proper body mechanics in against gravity tasks and ADLs  work tasks to minimize straining pelvic floor / back                  Baseline: not IND, improper form that places strain on pelvic floor                Goal status: ongoing     4. Pt will demo levelled pelvic girdle and shoulder height in order to progress to deep core strengthening HEP and restore mobility at spine, pelvis, gait,  posture   Baseline: R shoulder, L iliac crest higher, L convex lumbar curve  Goal status: MET     5. Pt will demo increased gait speed > 1.3 m/s in order to ambulate safely in community and return to fitness routine  Baseline: 1. 0 m/s, minimal trunk rotation  Goal status: ongoing ( need to assess)      Mariane Masters, PT 10/29/2022, 9:38 AM

## 2022-10-29 NOTE — Patient Instructions (Signed)
Body mechanics training:    Breastfeeding: Extra pillow under armpit in addition to boppy pillow ** ADD AS EXERCISE TO MINIMIZE MINDBACK PAIN-- 3 sec shuolder blade squeezes with finger counting 10 reps x 2 set per side   Carseat in a chair 45 deg to your feeding chair to place her in after feeding so you can rise out of the chair without weight  Stair-climbing:  45 deg turned to rail, step to   Changing table:  Standing with ski track stance 45 deg to table instead of forward bending.   Bending down into stroller: Standing with ski track stance 45 deg to table instead of forward bending.   Lifting: Squat or ski-track standing (one foot forward, shift weight into back leg as you lift)  Stand at an angle to keep her close to your center   ___  Deep core level 1-2 daily   ___   Avoid straining pelvic floor, abdominal muscles , spine  Use log rolling technique instead of getting out of bed with your neck or the sit-up     Log rolling into and out of bed   Log rolling into and out of bed If getting out of bed on R side, Bent knees, scoot hips/ shoulder to L  Raise R arm completely overhead, rolling onto armpit  Then lower bent knees to bed to get into complete side lying position  Then drop legs off bed, and push up onto R elbow/forearm, and use L hand to push onto the bed    Dig elbows and feet to lift hte buttocks and scoot without lifting head

## 2023-09-02 ENCOUNTER — Emergency Department
Admission: EM | Admit: 2023-09-02 | Discharge: 2023-09-02 | Disposition: A | Discharge status: Home / Self Care | Payer: Self-pay

## 2023-09-02 ENCOUNTER — Emergency Department: Payer: Self-pay

## 2023-09-02 DIAGNOSIS — S93402A Sprain of unspecified ligament of left ankle, initial encounter: Secondary | ICD-10-CM | POA: Insufficient documentation

## 2023-09-02 DIAGNOSIS — X501XXA Overexertion from prolonged static or awkward postures, initial encounter: Secondary | ICD-10-CM | POA: Insufficient documentation

## 2023-09-02 DIAGNOSIS — S82892A Other fracture of left lower leg, initial encounter for closed fracture: Secondary | ICD-10-CM

## 2023-09-02 DIAGNOSIS — S8262XA Displaced fracture of lateral malleolus of left fibula, initial encounter for closed fracture: Secondary | ICD-10-CM | POA: Insufficient documentation

## 2023-09-02 NOTE — ED Triage Notes (Signed)
 Pt c/o L ankle pain since 7/25.  Pain score 5/10.  Pt reports she thinks she twisted it.  Sts it started feeling better and then started hurting again today.    Pt noted to walk through lobby and triage w/o difficulty.

## 2023-09-02 NOTE — ED Provider Notes (Signed)
 [facility] Emergency Department Provider Note     Event Date/Time   First MD Initiated Contact with Patient 09/02/23 2002     (approximate)   History   Ankle Pain   HPI  Heather [name] is a 23 y.o. female with a noncontributory medical history, presents to the ED endorsing a twisting mechanism injury to her left ankle.  The injury occurred last week.  Since that time she has had some intermittent pain.  She did not present for evaluation of her initial injury at onset.  She wrapped the ankle following the initial injury, until a few days ago.  She apparently sustained a mild reinjury today at work, twisting the ankle again.  She presents with some lateral left ankle swelling.    Physical Exam   Triage Vital Signs: ED Triage Vitals  Encounter Vitals Group     BP 09/02/23 1900 117/75     Girls Systolic BP Percentile --      Girls Diastolic BP Percentile --      Boys Systolic BP Percentile --      Boys Diastolic BP Percentile --      Pulse Rate 09/02/23 1900 74     Resp 09/02/23 1900 18     Temp 09/02/23 1900 97.6 F (36.4 C)     Temp Source 09/02/23 1900 Oral     SpO2 09/02/23 1900 98 %     Weight --      Height 09/02/23 1901 5' 5 (1.651 m)     Head Circumference --      Peak Flow --      Pain Score 09/02/23 1901 5     Pain Loc --      Pain Education --      Exclude from Growth Chart --     Most recent vital signs: Vitals:   09/02/23 1900  BP: 117/75  Pulse: 74  Resp: 18  Temp: 97.6 F (36.4 C)  SpO2: 98%    General Awake, no distress. NAD HEENT NCAT. PERRL. EOMI. No rhinorrhea. Mucous membranes are moist.  CV:  Good peripheral perfusion.  RESP:  Normal effort.  MSK:  Left ankle without obvious deformity or dislocation.  Lateral soft tissue swelling noted about the lateral malleolus.  Ankle range of motion is fluid and intact.  No calf or Achilles tenderness is elicited.   ED Results / Procedures / Treatments   Labs (all  labs ordered are listed, but only abnormal results are displayed) Labs Reviewed - No data to display   EKG   RADIOLOGY  I personally viewed and evaluated these images as part of my medical decision making, as well as reviewing the written report by the radiologist.  ED Provider Interpretation: No acute fracture or dislocation; distal radial tip bone island; ? acute versus chronic  DG Ankle Complete Left Result Date: 09/02/2023 CLINICAL DATA:  Left ankle pain and swelling. EXAM: LEFT ANKLE COMPLETE - 3+ VIEW COMPARISON:  None Available. FINDINGS: Age indeterminate, possibly acute, mildly displaced fracture of the tip of the lateral malleolus. Correlation with clinical exam and point tenderness recommended. No dislocation. The ankle mortise is intact. Mild soft tissue swelling over the lateral ankle. No radiopaque foreign object or soft tissue gas. IMPRESSION: Fracture of the tip of the lateral malleolus. Electronically Signed   By: Vanetta Chou M.D.   On: 09/02/2023 20:03    PROCEDURES:  Critical Care performed: No  Procedures   MEDICATIONS ORDERED  IN ED: Medications - No data to display   IMPRESSION / MDM / ASSESSMENT AND PLAN / ED COURSE  I reviewed the triage vital signs and the nursing notes.                              Differential diagnosis includes, but is not limited to, sprain, fracture, tendinitis  Patient's presentation is most consistent with acute complicated illness / injury requiring diagnostic workup.  Patient's diagnosis is consistent with ankle sprain on the left with recent reinjury.  Patient presents endorsing lateral ankle pain and swelling after reinjury today.  She reported initial injury about a week ago and treated the injury with RICE.  She presents today for evaluation, and x-ray films interpreted by me, show a questionable bone island to the distal fibula.  Remainder patient's exam is reassuring at this time.  Patient will be placed in ASO ankle  brace for support. Patient will be discharged home with instructions take OTC brace. Patient is to follow up with podiatry as discussed, as needed or otherwise directed. Patient is given ED precautions to return to the ED for any worsening or new symptoms.   FINAL CLINICAL IMPRESSION(S) / ED DIAGNOSES   Final diagnoses:  Sprain of left ankle, unspecified ligament, initial encounter     Rx / DC Orders   ED Discharge Orders     None        Note:  This document was prepared using Dragon voice recognition software and may include unintentional dictation errors.    Loyd Candida LULLA Aldona, PA-C 09/02/23 2041    Nicholaus Rolland BRAVO, MD 09/02/23 [phone]

## 2023-09-02 NOTE — Discharge Instructions (Addendum)
 Your exam shows a possible avulsion fracture to the outside of your ankle related to your injury.  Wear the lace up ankle brace as needed for support.  Rest with the foot elevated apply ice to help to swelling.  Manso do warm Epsom salt soaks to help increase range of motion.  Take OTC Tylenol or Motrin as needed.  Follow-up with [facility] as suggested.
